# Patient Record
Sex: Male | Born: 1995 | Race: White | Hispanic: No | Marital: Single | Smoking: Current every day smoker
Health system: Southern US, Community
[De-identification: ages and names within clinical notes are randomized; demographics above are authoritative.]

## PROBLEM LIST (undated history)

## (undated) DIAGNOSIS — R1110 Vomiting, unspecified: Secondary | ICD-10-CM

## (undated) DIAGNOSIS — R197 Diarrhea, unspecified: Secondary | ICD-10-CM

## (undated) HISTORY — PX: SKIN GRAFT: SHX250

## (undated) HISTORY — DX: Vomiting, unspecified: R11.10

## (undated) HISTORY — DX: Diarrhea, unspecified: R19.7

---
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
## 2016-03-19 ENCOUNTER — Encounter: Payer: Self-pay | Admitting: Emergency Medicine

## 2016-03-19 ENCOUNTER — Ambulatory Visit (HOSPITAL_COMMUNITY)
Admission: AD | Admit: 2016-03-19 | Discharge: 2016-03-19 | Disposition: A | Discharge status: Home / Self Care | Payer: BLUE CROSS/BLUE SHIELD | Source: Other Acute Inpatient Hospital | Attending: Emergency Medicine | Admitting: Emergency Medicine

## 2016-03-19 ENCOUNTER — Emergency Department
Admission: EM | Admit: 2016-03-19 | Discharge: 2016-03-19 | Payer: BLUE CROSS/BLUE SHIELD | Attending: Emergency Medicine | Admitting: Emergency Medicine

## 2016-03-19 DIAGNOSIS — Y929 Unspecified place or not applicable: Secondary | ICD-10-CM | POA: Diagnosis not present

## 2016-03-19 DIAGNOSIS — T300 Burn of unspecified body region, unspecified degree: Secondary | ICD-10-CM | POA: Insufficient documentation

## 2016-03-19 DIAGNOSIS — X58XXXA Exposure to other specified factors, initial encounter: Secondary | ICD-10-CM | POA: Insufficient documentation

## 2016-03-19 DIAGNOSIS — Y999 Unspecified external cause status: Secondary | ICD-10-CM | POA: Diagnosis not present

## 2016-03-19 DIAGNOSIS — Y939 Activity, unspecified: Secondary | ICD-10-CM | POA: Insufficient documentation

## 2016-03-19 DIAGNOSIS — T23301A Burn of third degree of right hand, unspecified site, initial encounter: Secondary | ICD-10-CM | POA: Insufficient documentation

## 2016-03-19 DIAGNOSIS — X088XXA Exposure to other specified smoke, fire and flames, initial encounter: Secondary | ICD-10-CM | POA: Insufficient documentation

## 2016-03-19 DIAGNOSIS — T23302A Burn of third degree of left hand, unspecified site, initial encounter: Secondary | ICD-10-CM | POA: Insufficient documentation

## 2016-03-19 DIAGNOSIS — T2104XA Burn of unspecified degree of lower back, initial encounter: Secondary | ICD-10-CM | POA: Diagnosis present

## 2016-03-19 DIAGNOSIS — T2134XA Burn of third degree of lower back, initial encounter: Secondary | ICD-10-CM | POA: Insufficient documentation

## 2016-03-19 DIAGNOSIS — T23341A Burn of third degree of multiple right fingers (nail), including thumb, initial encounter: Secondary | ICD-10-CM | POA: Insufficient documentation

## 2016-03-19 DIAGNOSIS — T23342A Burn of third degree of multiple left fingers (nail), including thumb, initial encounter: Secondary | ICD-10-CM | POA: Diagnosis not present

## 2016-03-19 LAB — BASIC METABOLIC PANEL
ANION GAP: 13 (ref 5–15)
BUN: 15 mg/dL (ref 6–20)
CHLORIDE: 104 mmol/L (ref 101–111)
CO2: 21 mmol/L — ABNORMAL LOW (ref 22–32)
CREATININE: 1.11 mg/dL (ref 0.61–1.24)
Calcium: 9.6 mg/dL (ref 8.9–10.3)
GFR calc non Af Amer: >60 mL/min (ref >60)
Glucose, Bld: 115 mg/dL — ABNORMAL HIGH (ref 65–99)
Potassium: 3.8 mmol/L (ref 3.5–5.1)
Sodium: 138 mmol/L (ref 135–145)

## 2016-03-19 LAB — CBC WITH DIFFERENTIAL/PLATELET
BASOS ABS: 0 10*3/uL (ref 0–0.1)
Basophils Relative: 0 %
EOS ABS: 0 10*3/uL (ref 0–0.7)
Eosinophils Relative: 0 %
HEMATOCRIT: 45.5 % (ref 40.0–52.0)
HEMOGLOBIN: 15.7 g/dL (ref 13.0–18.0)
LYMPHS PCT: 55 %
Lymphs Abs: 8.6 10*3/uL — ABNORMAL HIGH (ref 1.0–3.6)
MCH: 31.4 pg (ref 26.0–34.0)
MCHC: 34.5 g/dL (ref 32.0–36.0)
MCV: 91.1 fL (ref 80.0–100.0)
MONOS PCT: 11 %
Monocytes Absolute: 1.7 10*3/uL — ABNORMAL HIGH (ref 0.2–1.0)
NEUTROS ABS: 5.3 10*3/uL (ref 1.4–6.5)
NEUTROS PCT: 34 %
Platelets: 234 10*3/uL (ref 150–440)
RBC: 4.99 MIL/uL (ref 4.40–5.90)
RDW: 12.5 % (ref 11.5–14.5)
WBC: 15.6 10*3/uL — AB (ref 3.8–10.6)

## 2016-03-19 LAB — LACTIC ACID, PLASMA: Lactic Acid, Venous: 2.9 mmol/L (ref 0.5–1.9)

## 2016-03-19 MED ORDER — LACTATED RINGERS IV SOLN
INTRAVENOUS | Status: DC
  Administered 2016-03-19: 15:00:00 via INTRAVENOUS

## 2016-03-19 MED ORDER — HYDROMORPHONE HCL 1 MG/ML IJ SOLN: 0.5000 mg | Freq: Once | INTRAMUSCULAR | Status: DC

## 2016-03-19 MED ORDER — HYDROMORPHONE HCL 1 MG/ML IJ SOLN
INTRAMUSCULAR | Status: AC
  Administered 2016-03-19: 1 mg via INTRAVENOUS
  Filled 2016-03-19: qty 1

## 2016-03-19 MED ORDER — HYDROMORPHONE HCL 1 MG/ML IJ SOLN: 1.0000 mg | Freq: Once | INTRAMUSCULAR | Status: AC

## 2016-03-19 MED ORDER — LACTATED RINGERS IV BOLUS (SEPSIS)
2000.0000 mL | Freq: Once | INTRAVENOUS | Status: AC
  Administered 2016-03-19: 2000 mL via INTRAVENOUS
  Filled 2016-03-19: qty 2000

## 2016-03-19 MED ORDER — HYDROMORPHONE HCL 1 MG/ML IJ SOLN
1.0000 mg | Freq: Once | INTRAMUSCULAR | Status: AC
  Administered 2016-03-19: 1 mg via INTRAVENOUS
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
## 2016-04-15 DIAGNOSIS — T2134XA Burn of third degree of lower back, initial encounter: Secondary | ICD-10-CM | POA: Insufficient documentation

## 2016-05-13 DIAGNOSIS — T23269A Burn of second degree of back of unspecified hand, initial encounter: Secondary | ICD-10-CM | POA: Insufficient documentation

## 2016-07-20 DIAGNOSIS — L299 Pruritus, unspecified: Secondary | ICD-10-CM | POA: Insufficient documentation

## 2016-07-20 DIAGNOSIS — L905 Scar conditions and fibrosis of skin: Secondary | ICD-10-CM | POA: Insufficient documentation

## 2021-10-30 ENCOUNTER — Emergency Department
Admission: EM | Admit: 2021-10-30 | Discharge: 2021-10-31 | Disposition: A | Discharge status: Home / Self Care | Payer: BC Managed Care – PPO | Attending: Emergency Medicine | Admitting: Emergency Medicine

## 2021-10-30 ENCOUNTER — Other Ambulatory Visit: Payer: Self-pay

## 2021-10-30 DIAGNOSIS — W5911XA Bitten by nonvenomous snake, initial encounter: Secondary | ICD-10-CM | POA: Insufficient documentation

## 2021-10-30 DIAGNOSIS — M79674 Pain in right toe(s): Secondary | ICD-10-CM | POA: Diagnosis present

## 2021-10-30 LAB — COMPREHENSIVE METABOLIC PANEL
ALT: 16 U/L (ref 0–44)
AST: 22 U/L (ref 15–41)
Albumin: 4.6 g/dL (ref 3.5–5.0)
Alkaline Phosphatase: 52 U/L (ref 38–126)
Anion gap: 10 (ref 5–15)
BUN: 11 mg/dL (ref 6–20)
CO2: 25 mmol/L (ref 22–32)
Calcium: 9.4 mg/dL (ref 8.9–10.3)
Chloride: 104 mmol/L (ref 98–111)
Creatinine, Ser: 1.12 mg/dL (ref 0.61–1.24)
GFR, Estimated: >60 mL/min (ref >60)
Glucose, Bld: 87 mg/dL (ref 70–99)
Potassium: 3.6 mmol/L (ref 3.5–5.1)
Sodium: 139 mmol/L (ref 135–145)
Total Bilirubin: 0.9 mg/dL (ref 0.3–1.2)
Total Protein: 7.2 g/dL (ref 6.5–8.1)

## 2021-10-30 LAB — CBC WITH DIFFERENTIAL/PLATELET
Abs Immature Granulocytes: 0.04 10*3/uL (ref 0.00–0.07)
Basophils Absolute: 0 10*3/uL (ref 0.0–0.1)
Basophils Relative: 0 %
Eosinophils Absolute: 0.2 10*3/uL (ref 0.0–0.5)
Eosinophils Relative: 2 %
HCT: 47.6 % (ref 39.0–52.0)
Hemoglobin: 17.2 g/dL — ABNORMAL HIGH (ref 13.0–17.0)
Immature Granulocytes: 0 %
Lymphocytes Relative: 29 %
Lymphs Abs: 2.8 10*3/uL (ref 0.7–4.0)
MCH: 31.6 pg (ref 26.0–34.0)
MCHC: 36.1 g/dL — ABNORMAL HIGH (ref 30.0–36.0)
MCV: 87.5 fL (ref 80.0–100.0)
Monocytes Absolute: 0.6 10*3/uL (ref 0.1–1.0)
Monocytes Relative: 6 %
Neutro Abs: 6 10*3/uL (ref 1.7–7.7)
Neutrophils Relative %: 63 %
Platelets: 300 10*3/uL (ref 150–400)
RBC: 5.44 MIL/uL (ref 4.22–5.81)
RDW: 11.6 % (ref 11.5–15.5)
WBC: 9.6 10*3/uL (ref 4.0–10.5)
nRBC: 0 % (ref 0.0–0.2)

## 2021-10-30 LAB — LACTIC ACID, PLASMA: Lactic Acid, Venous: 2.2 mmol/L (ref 0.5–1.9)

## 2021-10-30 LAB — FIBRINOGEN: Fibrinogen: 304 mg/dL (ref 210–475)

## 2021-10-30 LAB — PROTIME-INR
INR: 1 (ref 0.8–1.2)
Prothrombin Time: 13.2 seconds (ref 11.4–15.2)

## 2021-10-30 LAB — CK: Total CK: 48 U/L — ABNORMAL LOW (ref 49–397)

## 2021-10-30 MED ORDER — TETANUS-DIPHTH-ACELL PERTUSSIS 5-2.5-18.5 LF-MCG/0.5 IM SUSY
0.5000 mL | PREFILLED_SYRINGE | Freq: Once | INTRAMUSCULAR | Status: DC
  Filled 2021-10-30: qty 0.5

## 2021-10-30 MED ORDER — KETOROLAC TROMETHAMINE 15 MG/ML IJ SOLN
15.0000 mg | Freq: Once | INTRAMUSCULAR | Status: AC
  Administered 2021-10-30: 15 mg via INTRAVENOUS
  Filled 2021-10-30: qty 1

## 2021-10-30 MED ORDER — SODIUM CHLORIDE 0.9 % IV BOLUS
1000.0000 mL | Freq: Once | INTRAVENOUS | Status: AC
  Administered 2021-10-30: 1000 mL via INTRAVENOUS

## 2021-10-30 MED ORDER — MORPHINE SULFATE (PF) 4 MG/ML IV SOLN
4.0000 mg | Freq: Once | INTRAVENOUS | Status: AC
  Administered 2021-10-30: 4 mg via INTRAVENOUS
  Filled 2021-10-30: qty 1

## 2021-10-30 MED ORDER — ONDANSETRON HCL 4 MG/2ML IJ SOLN
4.0000 mg | Freq: Four times a day (QID) | INTRAMUSCULAR | Status: DC | PRN
  Administered 2021-10-30: 4 mg via INTRAVENOUS
  Filled 2021-10-30: qty 2
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
## 2021-10-30 NOTE — ED Provider Notes (Signed)
[facility] Provider Note    Event Date/Time   First MD Initiated Contact with Patient 10/30/21 2029     (approximate)   History   Chief Complaint: Snake Bite   HPI  Juan Vasquez is a 26 y.o. male with no significant past medical history who comes to the ED complaining of a suspected copperhead bite to his right great toe, occurring at about 8:00 PM tonight.  He was walking outside when he felt a sharp pain on the end of his toe.  He did not see what struck his toe, but shortly thereafter saw a snake in the grass which he identified as a copperhead, though he was unable to capture it.  Pain in the toe is severe, nonradiating, no aggravating or alleviating factors.  No chest pain or shortness of breath or fever.  No dizziness.  No known allergies.  Takes no medications.  Last tetanus vaccine greater than 10 years ago.     Physical Exam   Triage Vital Signs: ED Triage Vitals  Enc Vitals Group     BP 10/30/21 2022 (!) 142/101     Pulse Rate 10/30/21 2022 (!) 126     Resp 10/30/21 2022 (!) 26     Temp 10/30/21 2022 98 F (36.7 C)     Temp Source 10/30/21 2022 Oral     SpO2 10/30/21 2022 98 %     Weight 10/30/21 2023 163 lb (73.9 kg)     Height 10/30/21 2023 6\' 2"  (1.88 m)     Head Circumference --      Peak Flow --      Pain Score 10/30/21 2023 4     Pain Loc --      Pain Edu? --      Excl. in GC? --     Most recent vital signs: Vitals:   10/30/21 2235 10/30/21 2320  BP: 125/86 111/85  Pulse: 84 82  Resp: 20 18  Temp:    SpO2: 98% 98%    General: Awake, no distress.  CV:  Good peripheral perfusion.  Regular rate and rhythm.  Normal peripheral pulses.  Normal distal capillary refill in the right great toe Resp:  Normal effort.  Abd:  No distention.  Other:  2 cm area of swelling and discoloration over the distal tip of the right first toe.  No circumferential edema, no identifiable puncture marks.   ED Results / Procedures /  Treatments   Labs (all labs ordered are listed, but only abnormal results are displayed) Labs Reviewed  CBC WITH DIFFERENTIAL/PLATELET - Abnormal; Notable for the following components:      Result Value   Hemoglobin 17.2 (*)    MCHC 36.1 (*)    All other components within normal limits  CK - Abnormal; Notable for the following components:   Total CK 48 (*)    All other components within normal limits  LACTIC ACID, PLASMA - Abnormal; Notable for the following components:   Lactic Acid, Venous 2.2 (*)    All other components within normal limits  COMPREHENSIVE METABOLIC PANEL  PROTIME-INR  FIBRINOGEN     EKG    RADIOLOGY    PROCEDURES:  Procedures   MEDICATIONS ORDERED IN ED: Medications  ondansetron (ZOFRAN) injection 4 mg (4 mg Intravenous Given 10/30/21 2042)  Tdap (BOOSTRIX) injection 0.5 mL (0.5 mLs Intramuscular Patient Refused/Not Given 10/30/21 2135)  morphine (PF) 4 MG/ML injection 4 mg (4 mg Intravenous Given 10/30/21 2044)  sodium chloride 0.9 % bolus 1,000 mL (0 mLs Intravenous Stopped 10/30/21 2239)  ketorolac (TORADOL) 15 MG/ML injection 15 mg (15 mg Intravenous Given 10/30/21 2236)     IMPRESSION / MDM / ASSESSMENT AND PLAN / ED COURSE  I reviewed the triage vital signs and the nursing notes.                              Differential diagnosis includes, but is not limited to, blunt trauma with contusion, snakebite.  Doubt fracture, doubt foreign body  Patient's presentation is most consistent with acute presentation with potential threat to life or bodily function.  Patient presents with suspected copperhead bite on the tip of the right great toe.  He is complaining of severe pain, given IV morphine and Toradol for pain control.  Initial lab panel is unremarkable.  No systemic symptoms.  No signs of shock.  Will observe in ED for 6 hours for serial measurements to ensure is not having expanding edema or ecchymosis that may necessitate antivenom.        FINAL CLINICAL IMPRESSION(S) / ED DIAGNOSES   Final diagnoses:  Snake bite, initial encounter     Rx / DC Orders   ED Discharge Orders     None        Note:  This document was prepared using Dragon voice recognition software and may include unintentional dictation errors.   Sharman Cheek, MD 10/30/21 [phone]

## 2021-10-30 NOTE — Discharge Instructions (Signed)
Continue taking ibuprofen 600 mg and Tylenol 650 mg every 6 hours as needed for pain control.  Keep your leg elevated is much as possible over the next 48 hours.  If you develop severe pain or swelling, return to the emergency department.

## 2021-10-31 MED ORDER — GABAPENTIN 300 MG PO CAPS
300.0000 mg | ORAL_CAPSULE | Freq: Once | ORAL | Status: AC
  Administered 2021-10-31: 300 mg via ORAL
  Filled 2021-10-31: qty 1

## 2021-10-31 NOTE — ED Notes (Signed)
Patient verbalizes understanding of discharge instructions. Opportunity for questioning and answers were provided. Armband removed by staff, pt discharged from ED to home with mother via POV

## 2021-12-23 ENCOUNTER — Emergency Department: Payer: BC Managed Care – PPO

## 2021-12-23 ENCOUNTER — Other Ambulatory Visit: Payer: Self-pay

## 2021-12-23 ENCOUNTER — Encounter: Payer: Self-pay | Admitting: *Deleted

## 2021-12-23 DIAGNOSIS — R079 Chest pain, unspecified: Secondary | ICD-10-CM | POA: Diagnosis present

## 2021-12-23 DIAGNOSIS — Z5321 Procedure and treatment not carried out due to patient leaving prior to being seen by health care provider: Secondary | ICD-10-CM | POA: Insufficient documentation

## 2021-12-23 DIAGNOSIS — R42 Dizziness and giddiness: Secondary | ICD-10-CM | POA: Diagnosis not present

## 2021-12-23 DIAGNOSIS — R110 Nausea: Secondary | ICD-10-CM | POA: Diagnosis not present

## 2021-12-23 LAB — BASIC METABOLIC PANEL
Anion gap: 12 (ref 5–15)
BUN: 15 mg/dL (ref 6–20)
CO2: 23 mmol/L (ref 22–32)
Calcium: 9.8 mg/dL (ref 8.9–10.3)
Chloride: 105 mmol/L (ref 98–111)
Creatinine, Ser: 1.08 mg/dL (ref 0.61–1.24)
GFR, Estimated: >60 mL/min (ref >60)
Glucose, Bld: 88 mg/dL (ref 70–99)
Potassium: 3.7 mmol/L (ref 3.5–5.1)
Sodium: 140 mmol/L (ref 135–145)

## 2021-12-23 LAB — CBC
HCT: 49.8 % (ref 39.0–52.0)
Hemoglobin: 17.7 g/dL — ABNORMAL HIGH (ref 13.0–17.0)
MCH: 30.3 pg (ref 26.0–34.0)
MCHC: 35.5 g/dL (ref 30.0–36.0)
MCV: 85.3 fL (ref 80.0–100.0)
Platelets: 258 10*3/uL (ref 150–400)
RBC: 5.84 MIL/uL — ABNORMAL HIGH (ref 4.22–5.81)
RDW: 11.3 % — ABNORMAL LOW (ref 11.5–15.5)
WBC: 8.1 10*3/uL (ref 4.0–10.5)
nRBC: 0 % (ref 0.0–0.2)

## 2021-12-23 LAB — TROPONIN I (HIGH SENSITIVITY)
Troponin I (High Sensitivity): 3 ng/L (ref <18)
Troponin I (High Sensitivity): 3 ng/L (ref <18)

## 2021-12-23 LAB — LIPASE, BLOOD: Lipase: 28 U/L (ref 11–51)

## 2021-12-23 NOTE — ED Triage Notes (Signed)
Pt reports left chest pain for months.  Pt states today pain worse.  No anxiety meds.  Pt has intermittent dizziness and nausea.  Pt alert  speech clear.

## 2021-12-23 NOTE — ED Provider Triage Note (Signed)
Emergency Medicine Provider Triage Evaluation Note  Juan Vasquez , a 26 y.o. male  was evaluated in triage.  Pt complains of central and left-sided chest pain for months.  She denies any previous evaluation for his symptoms.  He denies any anxiety medications denies any smoking, denies any OTC medication.  He does endorse some intermittent dizziness and nausea with the pain.  He denies any recent injury, trauma, fall.  He also denies any fevers, chills, sweats.  Review of Systems  Positive: Chest pain/LUQ pain Negative: FCS,   Physical Exam  BP 123/84 (BP Location: Right Arm)   Pulse (!) 129   Temp 98.2 F (36.8 C) (Oral)   Resp 18   Ht 6\' 2"  (1.88 m)   Wt 73 kg   SpO2 94%   BMI 20.66 kg/m  Gen:   Awake, no distress  NAD Resp:  Normal effort CTA MSK:   Moves extremities without difficulty  ABD:  Soft, nontender  Medical Decision Making  Medically screening exam initiated at 6:14 PM.  Appropriate orders placed.  Wynn Maudlin was informed that the remainder of the evaluation will be completed by another provider, this initial triage assessment does not replace that evaluation, and the importance of remaining in the ED until their evaluation is complete.  Patient to the ED for evaluation of several months of intermittent left-sided chest pain with some left upper quadrant aminal pain.   Melvenia Needles, PA-C 12/23/21 1815

## 2021-12-24 ENCOUNTER — Emergency Department
Admission: EM | Admit: 2021-12-24 | Discharge: 2021-12-24 | Discharge status: Against Medical Advice | Payer: BC Managed Care – PPO | Attending: Emergency Medicine | Admitting: Emergency Medicine

## 2021-12-24 LAB — HEPATIC FUNCTION PANEL
ALT: 21 U/L (ref 0–44)
AST: 20 U/L (ref 15–41)
Albumin: 4.6 g/dL (ref 3.5–5.0)
Alkaline Phosphatase: 57 U/L (ref 38–126)
Bilirubin, Direct: 0.2 mg/dL (ref 0.0–0.2)
Indirect Bilirubin: 1.4 mg/dL — ABNORMAL HIGH (ref 0.3–0.9)
Total Bilirubin: 1.6 mg/dL — ABNORMAL HIGH (ref 0.3–1.2)
Total Protein: 7.2 g/dL (ref 6.5–8.1)

## 2023-03-19 ENCOUNTER — Ambulatory Visit (INDEPENDENT_AMBULATORY_CARE_PROVIDER_SITE_OTHER): Payer: Self-pay | Admitting: Family Medicine

## 2023-03-19 ENCOUNTER — Encounter: Payer: Self-pay | Admitting: Family Medicine

## 2023-03-19 VITALS — BP 128/88 | HR 97 | Temp 97.5°F | Resp 18 | Ht 74.0 in | Wt 213.2 lb

## 2023-03-19 DIAGNOSIS — N62 Hypertrophy of breast: Secondary | ICD-10-CM | POA: Insufficient documentation

## 2023-03-19 DIAGNOSIS — F419 Anxiety disorder, unspecified: Secondary | ICD-10-CM

## 2023-03-19 DIAGNOSIS — Z2839 Other underimmunization status: Secondary | ICD-10-CM | POA: Insufficient documentation

## 2023-03-19 MED ORDER — ESCITALOPRAM OXALATE 10 MG PO TABS: 10.0000 mg | ORAL_TABLET | Freq: Every day | ORAL | 5 refills | Status: DC

## 2023-03-19 NOTE — Assessment & Plan Note (Signed)
 Discussed HPV and Tdap.  Declined both because of cost.

## 2023-03-19 NOTE — Progress Notes (Signed)
   Established Patient Office Visit  Subjective   Patient ID: Juan Vasquez, male    DOB: Aug 08, 1995  Age: 28 y.o. MRN: [id number]  Chief Complaint  Patient presents with   Medical Management of Chronic Issues    HPI Here for refill of escitalopram  10 mg.  Makes him tired but it is that okay trade off because it controls his anxiety so well.  No sexual side effects.  Able to drive if someone else is in the car with him.    ROS    Objective:     BP 128/88 (BP Location: Right Arm, Patient Position: Sitting, Cuff Size: Normal)   Pulse 97   Temp (!) 97.5 F (36.4 C) (Oral)   Resp 18   Ht 6' 2 (1.88 m) Comment: per patient  Wt 213 lb 3.2 oz (96.7 kg)   SpO2 96%   BMI 27.37 kg/m    Physical Exam Vitals and nursing note reviewed.  Constitutional:      Appearance: Normal appearance.  HENT:     Head: Normocephalic and atraumatic.  Eyes:     Conjunctiva/sclera: Conjunctivae normal.  Cardiovascular:     Rate and Rhythm: Normal rate and regular rhythm.  Pulmonary:     Effort: Pulmonary effort is normal.     Breath sounds: Normal breath sounds.  Chest:     Comments: Soft pliable mass under nipple left.   Musculoskeletal:     Right lower leg: No edema.     Left lower leg: No edema.  Skin:    General: Skin is warm and dry.  Neurological:     Mental Status: He is alert and oriented to person, place, and time.  Psychiatric:        Mood and Affect: Mood normal.        Behavior: Behavior normal.        Thought Content: Thought content normal.        Judgment: Judgment normal.          No results found for any visits on 03/19/23.    The ASCVD Risk score (Arnett DK, et al., 2019) failed to calculate for the following reasons:   The 2019 ASCVD risk score is only valid for ages 73 to 75    Assessment & Plan:  Anxiety Assessment & Plan: 1. Anxiety (Primary) He is doing well on S-Citalopram 10 mg daily.  Denies any sexual side effects.  Does experience  fatigue.  Taking escitalopram  at night did not improve the feelings of fatigue so just takes it every morning now.  He can drive with someone else in the car.  Driving by himself he still cannot do very well because he has too much anxiety.     Immunizations incomplete  Gynecomastia, male Assessment & Plan: Feels like gynecomastia is soft and mobile.  Discussed getting an ultrasound for reassurance but he deferred because of cost.      Return in about 6 months (around 09/16/2023).    Lisa-Marie Rueger K Khye Hochstetler, MD

## 2023-03-19 NOTE — Assessment & Plan Note (Signed)
 Feels like gynecomastia is soft and mobile.  Discussed getting an ultrasound for reassurance but he deferred because of cost.

## 2023-03-19 NOTE — Assessment & Plan Note (Addendum)
 1. Anxiety (Primary) He is doing well on S-Citalopram 10 mg daily.  Denies any sexual side effects.  Does experience fatigue.  Taking escitalopram  at night did not improve the feelings of fatigue so just takes it every morning now.  He can drive with someone else in the car.  Driving by himself he still cannot do very well because he has too much anxiety.  Will assess his ability to drive in 6 months.  If still having difficulties we will consider increasing escitalopram  to 15 mg daily.

## 2023-07-15 ENCOUNTER — Other Ambulatory Visit: Payer: Self-pay | Admitting: Family Medicine

## 2023-07-15 NOTE — Telephone Encounter (Signed)
 Copied from CRM [id number]. Topic: Clinical - Medication Refill >> Jul 15, 2023 10:58 AM [name] B wrote: Most Recent Primary Care Visit:  Provider: ZIGLAR, SUSAN K  Department: [facility]  Visit Type: NEW PATIENT  Date: 03/19/2023  Medication: escitalopram  (LEXAPRO ) 10 MG  Has the patient contacted their pharmacy? Yes (Agent: If no, request that the patient contact the pharmacy for the refill. If patient does not wish to contact the pharmacy document the reason why and proceed with request.) (Agent: If yes, when and what did the pharmacy advise?)  Is this the correct pharmacy for this prescription? Yes If no, delete pharmacy and type the correct one.  This is the patient's preferred pharmacy:  [id number] [place], [place] - [address] [place], [place], [place] [address] Phone: [phone]    Has the prescription been filled recently? Yes  Is the patient out of the medication? No  Has the patient been seen for an appointment in the last year OR does the patient have an upcoming appointment? Yes  Can we respond through MyChart? No  Agent: Please be advised that Rx refills may take up to 3 business days. We ask that you follow-up with your pharmacy.

## 2023-07-16 MED ORDER — ESCITALOPRAM OXALATE 10 MG PO TABS: 10.0000 mg | ORAL_TABLET | Freq: Every day | ORAL | 5 refills | Status: DC

## 2023-09-22 ENCOUNTER — Telehealth: Payer: Self-pay

## 2023-09-22 NOTE — Telephone Encounter (Addendum)
 Was unable to reach patient nor leave voice mail due to mailbox being full. Office visit needs to be rescheduled due to office closing in compliance with water restrictions. Mychart message has been sent as well.

## 2023-09-24 ENCOUNTER — Ambulatory Visit: Payer: Self-pay | Admitting: Family Medicine

## 2023-10-15 ENCOUNTER — Ambulatory Visit (INDEPENDENT_AMBULATORY_CARE_PROVIDER_SITE_OTHER): Payer: Self-pay | Admitting: Family Medicine

## 2023-10-15 ENCOUNTER — Encounter: Payer: Self-pay | Admitting: Family Medicine

## 2023-10-15 ENCOUNTER — Other Ambulatory Visit: Payer: Self-pay | Admitting: Family Medicine

## 2023-10-15 VITALS — BP 140/96 | HR 94 | Resp 20 | Ht 74.0 in | Wt 214.0 lb

## 2023-10-15 DIAGNOSIS — F419 Anxiety disorder, unspecified: Secondary | ICD-10-CM

## 2023-10-15 DIAGNOSIS — L309 Dermatitis, unspecified: Secondary | ICD-10-CM

## 2023-10-15 DIAGNOSIS — L301 Dyshidrosis [pompholyx]: Secondary | ICD-10-CM

## 2023-10-15 MED ORDER — TACROLIMUS 0.1 % EX CREA: TOPICAL_CREAM | CUTANEOUS | 1 refills | Status: DC

## 2023-10-15 MED ORDER — ESCITALOPRAM OXALATE 20 MG PO TABS: 20.0000 mg | ORAL_TABLET | Freq: Every day | ORAL | 1 refills | Status: DC

## 2023-10-19 DIAGNOSIS — L301 Dyshidrosis [pompholyx]: Secondary | ICD-10-CM | POA: Insufficient documentation

## 2023-10-19 NOTE — Assessment & Plan Note (Signed)
 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  Trial tacrolimus  twice daily.  Try to get your hands wet and frequently use a moisturizer.

## 2023-10-19 NOTE — Progress Notes (Signed)
 Established Patient Office Visit  Subjective   Patient ID: Juan Vasquez, male    DOB: 1995-12-26  Age: 28 y.o. MRN: [id number]  Chief Complaint  Patient presents with   Medical Management of Chronic Issues    HPI 28 year old with a history of a poisonous snake bite to his great toe and anxiety.  He is on escitalopram  10 mg daily and reports that this is going pretty good and he does think it makes him more hungry.  He is able to drive if somebody else is in the car with him but he cannot drive alone he would like to increase the escitalopram  to 20 mg and see how he does.  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 He has got a rash on the palm of his hands and in his fingers that itches terribly it is on both hands.    ROS    Objective:     BP (!) 140/96 (BP Location: Left Arm, Patient Position: Sitting, Cuff Size: Normal)   Pulse 94   Resp 20   Ht 6' 2 (1.88 m)   Wt 214 lb (97.1 kg)   SpO2 97%   BMI 27.48 kg/m    Physical Exam Vitals and nursing note reviewed.  Constitutional:      Appearance: Normal appearance.  HENT:     Head: Normocephalic and atraumatic.  Eyes:     Conjunctiva/sclera: Conjunctivae normal.  Cardiovascular:     Rate and Rhythm: Normal rate and regular rhythm.  Pulmonary:     Effort: Pulmonary effort is normal.     Breath sounds: Normal breath sounds.  Musculoskeletal:     Right lower leg: No edema.     Left lower leg: No edema.  Skin:    General: Skin is warm and dry.     Comments: Blisters under his skin and peeling skin on palms and on fingers bilaterally.  Neurological:     Mental Status: He is alert and oriented to person, place, and time.  Psychiatric:        Mood and Affect: Mood normal.        Behavior: Behavior normal.        Thought Content: Thought content normal.        Judgment: Judgment normal.          No results found for any visits on 10/15/23.    The  ASCVD Risk score (Arnett DK, et al., 2019) failed to calculate for the following reasons:   The 2019 ASCVD risk score is only valid for ages 47 to 32    Assessment & Plan:  Anxiety Assessment & Plan: He is able to drive a car if there is somebody else in it but he is not able to drive alone.  Would like to increase his escitalopram .  Will increase to 20 mg daily and follow-up in a month  Orders: -     Escitalopram  Oxalate; Take 1 tablet (20 mg total) by mouth daily.  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  Trial tacrolimus  twice daily.  Try to get your hands wet and frequently use a moisturizer.      Return in about 4 weeks (  around 11/12/2023).    Ovidio Steele K Johnmatthew Solorio, MD

## 2023-10-19 NOTE — Assessment & Plan Note (Signed)
 He is able to drive a car if there is somebody else in it but he is not able to drive alone.  Would like to increase his escitalopram .  Will increase to 20 mg daily and follow-up in a month

## 2023-11-12 NOTE — Telephone Encounter (Signed)
 Please advise if there is an alternate that could be prescribed with prior med prescribed being on backorder

## 2023-11-30 ENCOUNTER — Other Ambulatory Visit: Payer: Self-pay | Admitting: Family Medicine

## 2023-11-30 DIAGNOSIS — L309 Dermatitis, unspecified: Secondary | ICD-10-CM

## 2024-01-14 ENCOUNTER — Encounter: Payer: Self-pay | Admitting: Family Medicine

## 2024-01-14 ENCOUNTER — Ambulatory Visit: Payer: Self-pay | Admitting: Family Medicine

## 2024-01-14 VITALS — BP 122/82 | HR 92 | Temp 97.7°F | Resp 18 | Ht 74.0 in | Wt 214.0 lb

## 2024-01-14 DIAGNOSIS — L301 Dyshidrosis [pompholyx]: Secondary | ICD-10-CM

## 2024-01-14 DIAGNOSIS — F419 Anxiety disorder, unspecified: Secondary | ICD-10-CM

## 2024-01-14 DIAGNOSIS — Z23 Encounter for immunization: Secondary | ICD-10-CM

## 2024-01-14 MED ORDER — TACROLIMUS 0.1 % EX OINT: TOPICAL_OINTMENT | Freq: Two times a day (BID) | CUTANEOUS | 0 refills | Status: DC

## 2024-01-14 MED ORDER — TACROLIMUS 0.1 % EX CREA: TOPICAL_CREAM | CUTANEOUS | 5 refills | Status: DC

## 2024-01-14 MED ORDER — ESCITALOPRAM OXALATE 20 MG PO TABS: 20.0000 mg | ORAL_TABLET | Freq: Every day | ORAL | 1 refills | Status: AC

## 2024-01-14 NOTE — Assessment & Plan Note (Signed)
 Doing a lot better with Lexapro .  Regular sleep-wake cycle is also improved his mood as has working out in [employer]  Continue Lexapro 

## 2024-01-14 NOTE — Progress Notes (Signed)
 Established Patient Office Visit  Subjective   Patient ID: Juan Vasquez, male    DOB: Jul 23, 1995  Age: 28 y.o. MRN: [id number]  Chief Complaint  Patient presents with   Medical Management of Chronic Issues   Anxiety    HPI  Discussed the use of AI scribe software for clinical note transcription with the patient, who gave verbal consent to proceed.  History of Present Illness   Juan Vasquez is a 28 year old male with dyshidrotic eczema who presents with worsening eczema symptoms and medication management.  He describes eczema symptoms on his hands as painful and significantly bothersome, stating that it is 'getting bad as crap.' He has not received the prescribed ointment due to a pharmacy issue, as he only had cream available. He has been in contact with the pharmacy and the medical office to resolve this issue.  He has been taking Lexapro  for depression but experienced a disruption in his medication routine due to a recent illness. Approximately two weeks ago, he became sick after starting to go to the gym, which he attributes to being in a public setting. During this time, he was vomiting and unable to keep his medication down, leading to missed doses. He feels better now and has resumed his regular dosing.  He is doing short trips driving his car but his brother had to bring him to the office because it is further and requires the expressway.     He mentions improvements in his sleep schedule, stating that it is now more routine, which has positively impacted his mood. He has also been making dietary changes to address high blood pressure, including reducing salt and sugar intake and eating a healthier diet. He wants to continue exercising, as it improves his mood.    Objective:     BP 122/82 (BP Location: Left Arm, Patient Position: Sitting, Cuff Size: Normal)   Pulse 92   Temp 97.7 F (36.5 C) (Oral)   Resp 18   Ht 6' 2 (1.88 m)   Wt 214 lb (97.1 kg)   SpO2  95%   BMI 27.48 kg/m    Physical Exam Vitals reviewed.  Constitutional:      Appearance: Normal appearance.  HENT:     Head: Normocephalic.  Eyes:     General:        Right eye: No discharge.        Left eye: No discharge.  Cardiovascular:     Rate and Rhythm: Normal rate.  Pulmonary:     Effort: Pulmonary effort is normal.  Neurological:     Mental Status: He is alert and oriented to person, place, and time.  Psychiatric:        Mood and Affect: Mood normal.        Behavior: Behavior normal.        Thought Content: Thought content normal.        Judgment: Judgment normal.          No results found for any visits on 01/14/24.    The ASCVD Risk score (Arnett DK, et al., 2019) failed to calculate for the following reasons:   The 2019 ASCVD risk score is only valid for ages 36 to 18    Assessment & Plan:  Dyshidrotic eczema Assessment & Plan: Was not able to get the tacrolimus  because it need to be written for the cream and not the ointment.  Corrected this for him today use tacrolimus  twice a day  on your hands.  Orders: -     Tacrolimus ; Apply a pea sized amount to hands and rub in thoroughly twice a day  Dispense: 30 g; Refill: 5  Anxiety Assessment & Plan: Doing a lot better with Lexapro .  Regular sleep-wake cycle is also improved his mood as has working out in [employer]  Continue Lexapro   Orders: -     Escitalopram  Oxalate; Take 1 tablet (20 mg total) by mouth daily.  Dispense: 90 tablet; Refill: 1  Immunization due -     Flu vaccine trivalent PF, 6mos and older(Flulaval,Afluria,Fluarix,Fluzone)     Return in about 6 months (around 07/13/2024).    Adrian Dinovo K Stella Bortle, MD

## 2024-01-14 NOTE — Assessment & Plan Note (Signed)
 Was not able to get the tacrolimus  because it need to be written for the cream and not the ointment.  Corrected this for him today use tacrolimus  twice a day on your hands.

## 2024-04-20 ENCOUNTER — Other Ambulatory Visit: Payer: Self-pay | Admitting: Family Medicine

## 2024-04-20 DIAGNOSIS — L301 Dyshidrosis [pompholyx]: Secondary | ICD-10-CM

## 2024-07-07 ENCOUNTER — Ambulatory Visit: Payer: Self-pay | Admitting: Family Medicine
